# Patient Record
Sex: Female | Born: 1990 | Hispanic: No | Marital: Married | Smoking: Never smoker
Health system: Southern US, Community
[De-identification: ages and names within clinical notes are randomized; demographics above are authoritative.]

## PROBLEM LIST (undated history)

## (undated) DIAGNOSIS — F419 Anxiety disorder, unspecified: Secondary | ICD-10-CM

## (undated) DIAGNOSIS — F32A Depression, unspecified: Secondary | ICD-10-CM

## (undated) DIAGNOSIS — Z789 Other specified health status: Secondary | ICD-10-CM

## (undated) HISTORY — PX: CYST REMOVAL HAND: SHX6279

## (undated) HISTORY — PX: TONSILLECTOMY: SUR1361

---

## 2017-04-03 ENCOUNTER — Other Ambulatory Visit (HOSPITAL_COMMUNITY): Payer: Self-pay | Admitting: *Deleted

## 2017-04-03 DIAGNOSIS — O283 Abnormal ultrasonic finding on antenatal screening of mother: Secondary | ICD-10-CM

## 2017-04-19 ENCOUNTER — Encounter (HOSPITAL_COMMUNITY): Payer: Self-pay | Admitting: *Deleted

## 2017-04-20 ENCOUNTER — Encounter (HOSPITAL_COMMUNITY): Payer: Self-pay

## 2017-04-20 ENCOUNTER — Ambulatory Visit (HOSPITAL_COMMUNITY)
Admission: RE | Admit: 2017-04-20 | Discharge: 2017-04-20 | Disposition: A | Discharge status: Home / Self Care | Payer: PRIVATE HEALTH INSURANCE | Source: Ambulatory Visit | Attending: *Deleted | Admitting: *Deleted

## 2017-04-20 ENCOUNTER — Other Ambulatory Visit (HOSPITAL_COMMUNITY): Payer: Self-pay | Admitting: *Deleted

## 2017-04-20 DIAGNOSIS — E669 Obesity, unspecified: Secondary | ICD-10-CM | POA: Diagnosis not present

## 2017-04-20 DIAGNOSIS — O358XX0 Maternal care for other (suspected) fetal abnormality and damage, not applicable or unspecified: Secondary | ICD-10-CM | POA: Insufficient documentation

## 2017-04-20 DIAGNOSIS — Z3A24 24 weeks gestation of pregnancy: Secondary | ICD-10-CM | POA: Insufficient documentation

## 2017-04-20 DIAGNOSIS — O283 Abnormal ultrasonic finding on antenatal screening of mother: Secondary | ICD-10-CM | POA: Insufficient documentation

## 2017-04-20 DIAGNOSIS — Z363 Encounter for antenatal screening for malformations: Secondary | ICD-10-CM | POA: Diagnosis present

## 2017-04-20 DIAGNOSIS — Z3689 Encounter for other specified antenatal screening: Secondary | ICD-10-CM

## 2017-04-20 DIAGNOSIS — Z0379 Encounter for other suspected maternal and fetal conditions ruled out: Secondary | ICD-10-CM

## 2017-04-20 DIAGNOSIS — O99212 Obesity complicating pregnancy, second trimester: Secondary | ICD-10-CM | POA: Diagnosis not present

## 2017-04-20 HISTORY — DX: Other specified health status: Z78.9

## 2017-04-20 NOTE — Progress Notes (Signed)
No consult note generated as EIF not found on fetal anatomic survey today.

## 2017-04-25 ENCOUNTER — Encounter (HOSPITAL_COMMUNITY): Payer: Self-pay

## 2017-11-02 ENCOUNTER — Encounter (HOSPITAL_COMMUNITY): Payer: Self-pay

## 2020-09-29 ENCOUNTER — Other Ambulatory Visit: Payer: Self-pay | Admitting: Podiatry

## 2020-09-29 DIAGNOSIS — M79671 Pain in right foot: Secondary | ICD-10-CM

## 2020-11-01 ENCOUNTER — Other Ambulatory Visit (HOSPITAL_COMMUNITY): Payer: PRIVATE HEALTH INSURANCE

## 2020-11-02 ENCOUNTER — Other Ambulatory Visit (HOSPITAL_COMMUNITY): Payer: PRIVATE HEALTH INSURANCE

## 2020-11-15 NOTE — Patient Instructions (Signed)
Dorothy Stokes  11/15/2020     @PREFPERIOPPHARMACY @   Your procedure is scheduled on  11/17/2020.   Report to 11/19/2020 at  0700  A.M.   Call this number if you have problems the morning of surgery:  [phone]   Remember:  Do not eat or drink after midnight.                        Take these medicines the morning of surgery with A SIP OF WATER  Prozac, lamictal, claritin.   Place clean sheets on your bed the night before your procedure. DO NOT sleep with pets this night.  Shower with CHG the night before and the morning of your procedure. DO NOT put CHG on your face, hair or genitals.  After each shower, dry off with a clean towel, put on clean, comfortable clothes and brush your teeth.      Do not wear jewelry, make-up or nail polish.  Do not wear lotions, powders, or perfumes, or deodorant.  Do not shave 48 hours prior to surgery.  Men may shave face and neck.  Do not bring valuables to the hospital.  [facility] is not responsible for any belongings or valuables.  Contacts, dentures or bridgework may not be worn into surgery.  Leave your suitcase in the car.  After surgery it may be brought to your room.  For patients admitted to the hospital, discharge time will be determined by your treatment team.  Patients discharged the day of surgery will not be allowed to drive home and must have someone with them for 24 hours.    Special instructions:   DO NOT smoke tobacco or vape the morning of your procedure.   Please read over the following fact sheets that you were given. Coughing and Deep Breathing, Surgical Site Infection Prevention, Anesthesia Post-op Instructions and Care and Recovery After Surgery       Bunion Surgery, Care After This sheet gives you information about how to care for yourself after your procedure. Your health care provider may also give you more specific instructions. If you have problems or questions, contact your health care  provider. What can I expect after the procedure? After the procedure, it is common to have:  Redness.  Pain.  Swelling.  A small amount of fluid coming from your incision. Follow these instructions at home: If you have a post-operative brace, boot, or shoe:  Wear the post-op (post-operative) brace, boot, or shoe as told by your health care provider. Remove it only as told by your health care provider.  Loosen the brace, boot, or shoe if your toes tingle, become numb, or turn cold and blue.  Keep the brace, boot, or shoe clean and dry. If you have a cast:  Do not put pressure on any part of the cast until it is fully hardened. This may take several hours.  Do not stick anything inside the cast to scratch your skin. Doing that increases your risk of infection.  Check the skin around the cast every day. Tell your health care provider about any concerns.  You may put lotion on dry skin around the edges of the cast. Do not put lotion on the skin underneath the cast.  Keep the cast clean and dry. Bathing  Do not take baths, swim, or use a hot tub until your health care provider approves. Ask your health care provider if you may  take showers.  If your brace, boot, shoe, or cast is not waterproof: ? Do not let it get wet. ? Cover it with a watertight covering when you take a bath or a shower.  Keep your bandage (dressing) dry until your health care provider says it can be removed. Incision care  The dressing holds your toe in the correct position. Do not change the dressing until your health care provider approves.  Follow instructions from your health care provider about how to take care of your incision. Make sure you: ? Wash your hands with soap and water for at least 20 seconds before and after you change your dressing. If soap and water are not available, use hand sanitizer. ? Change your dressing as told by your health care provider. ? Leave stitches (sutures), skin glue,  or adhesive strips in place. These skin closures may need to stay in place for 2 weeks or longer. If adhesive strip edges start to loosen and curl up, you may trim the loose edges. Do not remove adhesive strips completely unless your health care provider tells you to do that.  Check your incision area every day for signs of infection. Check for: ? More redness, swelling, or pain. ? Blood or more fluid. ? Warmth. ? Pus or a bad smell.   Managing pain, stiffness, and swelling  If directed, put ice on the affected area. To do this: ? If you have a removable brace, boot, or shoe, remove it as told by your health care provider. ? Put ice in a plastic bag. ? Place a towel between your skin and the bag or between your cast and the bag. ? Leave the ice on for 20 minutes, 2-3 times a day. ? Remove the ice if your skin turns bright red. This is very important. If you cannot feel pain, heat, or cold, you have a greater risk of damage to the area.  Move your toes often to reduce stiffness and swelling.  Raise (elevate) the injured area above the level of your heart while you are sitting or lying down.   Activity  Return to your normal activities as told by your health care provider. Ask your health care provider what activities are safe for you.  If physical therapy was prescribed, do exercises as told by your health care provider. Driving  If you were given a sedative during the procedure, it can affect you for several hours. Do not drive or operate machinery until your health care provider says that it is safe.  Ask your health care provider when it is safe to drive if you have a brace, boot, shoe, or cast on your foot. Safety Do not use the affected leg to support (bear) your body weight until your health care provider says that you can. Follow weight-bearing restrictions as told. Use crutches, a cane, or a walker as told by your health care provider. General instructions  Do not use any  products that contain nicotine or tobacco, such as cigarettes, e-cigarettes, and chewing tobacco. If you need help quitting, ask your health care provider.  Take over-the-counter and prescription medicines only as told by your health care provider.  Your medicines may cause constipation. To prevent or treat constipation, you may need to: ? Drink enough fluid to keep your urine pale yellow. ? Take over-the-counter or prescription medicines. ? Eat foods that are high in fiber, such as beans, whole grains, and fresh fruits and vegetables. ? Limit foods that are high  in fat and processed sugars, such as fried or sweet foods.  Do not wear high heels or tight-fitting shoes, even after you heal.  Keep all follow-up visits. This is important. Contact a health care provider if:  You have any of these signs of infection: ? More redness, swelling, or pain around your incision. ? Blood or more fluid coming from your incision. ? Warmth coming from your incision. ? Pus or a bad smell coming from your incision. ? A fever or chills.  Your dressing gets wet or it falls off.  You have swelling in your lower leg or calf.  You have numbness or stiffness in your toes. Get help right away if:  You have severe pain.  You have shortness of breath or trouble breathing.  You have chest pain.  You have redness, swelling, pain, or warmth in your calf or leg. These symptoms may represent a serious problem that is an emergency. Do not wait to see if the symptoms will go away. Get medical help right away. Call your local emergency services (911 in the [place] Do not drive yourself to the hospital. Summary  If directed, put ice on the affected area. Leave the ice on for 20 minutes, 2-3 times a day.  Ask your health care provider when it is safe to drive if you have a brace, boot, shoe, or cast on your foot.  Do not use the affected leg to support (bear) your body weight until your health care provider says  that you can. Follow weight-bearing restrictions as told. Use crutches, a cane, or a walker as told by your health care provider. This information is not intended to replace advice given to you by your health care provider. Make sure you discuss any questions you have with your health care provider. Document Revised: 12/19/2019 Document Reviewed: 12/19/2019 Elsevier Patient Education  2021 [employer] Monitored Anesthesia Care, Care After This sheet gives you information about how to care for yourself after your procedure. Your health care provider may also give you more specific instructions. If you have problems or questions, contact your health care provider. What can I expect after the procedure? After the procedure, it is common to have:  Tiredness.  Forgetfulness about what happened after the procedure.  Impaired judgment for important decisions.  Nausea or vomiting.  Some difficulty with balance. Follow these instructions at home: For the time period you were told by your health care provider:  Rest as needed.  Do not participate in activities where you could fall or become injured.  Do not drive or use machinery.  Do not drink alcohol.  Do not take sleeping pills or medicines that cause drowsiness.  Do not make important decisions or sign legal documents.  Do not take care of children on your own.      Eating and drinking  Follow the diet that is recommended by your health care provider.  Drink enough fluid to keep your urine pale yellow.  If you vomit: ? Drink water, juice, or soup when you can drink without vomiting. ? Make sure you have little or no nausea before eating solid foods. General instructions  Have a responsible adult stay with you for the time you are told. It is important to have someone help care for you until you are awake and alert.  Take over-the-counter and prescription medicines only as told by your health care provider.  If you have  sleep apnea, surgery and certain medicines can increase your  risk for breathing problems. Follow instructions from your health care provider about wearing your sleep device: ? Anytime you are sleeping, including during daytime naps. ? While taking prescription pain medicines, sleeping medicines, or medicines that make you drowsy.  Avoid smoking.  Keep all follow-up visits as told by your health care provider. This is important. Contact a health care provider if:  You keep feeling nauseous or you keep vomiting.  You feel light-headed.  You are still sleepy or having trouble with balance after 24 hours.  You develop a rash.  You have a fever.  You have redness or swelling around the IV site. Get help right away if:  You have trouble breathing.  You have new-onset confusion at home. Summary  For several hours after your procedure, you may feel tired. You may also be forgetful and have poor judgment.  Have a responsible adult stay with you for the time you are told. It is important to have someone help care for you until you are awake and alert.  Rest as told. Do not drive or operate machinery. Do not drink alcohol or take sleeping pills.  Get help right away if you have trouble breathing, or if you suddenly become confused. This information is not intended to replace advice given to you by your health care provider. Make sure you discuss any questions you have with your health care provider. Document Revised: 04/29/2020 Document Reviewed: 07/17/2019 Elsevier Patient Education  2021 [employer]

## 2020-11-16 ENCOUNTER — Other Ambulatory Visit: Payer: Self-pay

## 2020-11-16 ENCOUNTER — Ambulatory Visit (HOSPITAL_COMMUNITY)
Admission: RE | Admit: 2020-11-16 | Discharge: 2020-11-16 | Disposition: A | Discharge status: Home / Self Care | Payer: PRIVATE HEALTH INSURANCE | Source: Ambulatory Visit | Attending: Podiatry | Admitting: Podiatry

## 2020-11-16 ENCOUNTER — Encounter (HOSPITAL_COMMUNITY)
Admission: RE | Admit: 2020-11-16 | Discharge: 2020-11-16 | Disposition: A | Discharge status: Home / Self Care | Payer: PRIVATE HEALTH INSURANCE | Source: Ambulatory Visit | Attending: Podiatry | Admitting: Podiatry

## 2020-11-16 ENCOUNTER — Other Ambulatory Visit (HOSPITAL_COMMUNITY)
Admission: RE | Admit: 2020-11-16 | Discharge: 2020-11-16 | Disposition: A | Discharge status: Home / Self Care | Payer: PRIVATE HEALTH INSURANCE | Source: Ambulatory Visit | Attending: Podiatry | Admitting: Podiatry

## 2020-11-16 ENCOUNTER — Encounter (HOSPITAL_COMMUNITY): Payer: Self-pay

## 2020-11-16 DIAGNOSIS — M79671 Pain in right foot: Secondary | ICD-10-CM | POA: Insufficient documentation

## 2020-11-16 DIAGNOSIS — Z01812 Encounter for preprocedural laboratory examination: Secondary | ICD-10-CM | POA: Diagnosis present

## 2020-11-16 DIAGNOSIS — Z20822 Contact with and (suspected) exposure to covid-19: Secondary | ICD-10-CM | POA: Diagnosis not present

## 2020-11-16 HISTORY — DX: Depression, unspecified: F32.A

## 2020-11-16 HISTORY — DX: Anxiety disorder, unspecified: F41.9

## 2020-11-16 LAB — SARS CORONAVIRUS 2 (TAT 6-24 HRS): SARS Coronavirus 2: NEGATIVE

## 2020-11-16 LAB — PREGNANCY, URINE: Preg Test, Ur: NEGATIVE

## 2020-11-17 ENCOUNTER — Ambulatory Visit (HOSPITAL_COMMUNITY): Payer: PRIVATE HEALTH INSURANCE

## 2020-11-17 ENCOUNTER — Encounter (HOSPITAL_COMMUNITY)
Admission: RE | Disposition: A | Discharge status: Home / Self Care | Payer: Self-pay | Source: Home / Self Care | Attending: Podiatry

## 2020-11-17 ENCOUNTER — Ambulatory Visit (HOSPITAL_COMMUNITY)
Admission: RE | Admit: 2020-11-17 | Discharge: 2020-11-17 | Disposition: A | Discharge status: Home / Self Care | Payer: PRIVATE HEALTH INSURANCE | Attending: Podiatry | Admitting: Podiatry

## 2020-11-17 ENCOUNTER — Ambulatory Visit (HOSPITAL_COMMUNITY): Payer: PRIVATE HEALTH INSURANCE | Admitting: Certified Registered"

## 2020-11-17 ENCOUNTER — Encounter (HOSPITAL_COMMUNITY): Payer: Self-pay

## 2020-11-17 DIAGNOSIS — Z91040 Latex allergy status: Secondary | ICD-10-CM | POA: Insufficient documentation

## 2020-11-17 DIAGNOSIS — Z8249 Family history of ischemic heart disease and other diseases of the circulatory system: Secondary | ICD-10-CM | POA: Insufficient documentation

## 2020-11-17 DIAGNOSIS — Z79899 Other long term (current) drug therapy: Secondary | ICD-10-CM | POA: Insufficient documentation

## 2020-11-17 DIAGNOSIS — M1990 Unspecified osteoarthritis, unspecified site: Secondary | ICD-10-CM | POA: Diagnosis not present

## 2020-11-17 DIAGNOSIS — M2011 Hallux valgus (acquired), right foot: Secondary | ICD-10-CM | POA: Insufficient documentation

## 2020-11-17 DIAGNOSIS — Z833 Family history of diabetes mellitus: Secondary | ICD-10-CM | POA: Diagnosis not present

## 2020-11-17 DIAGNOSIS — Z9889 Other specified postprocedural states: Secondary | ICD-10-CM

## 2020-11-17 HISTORY — PX: BUNIONECTOMY: SHX129

## 2020-11-17 SURGERY — BUNIONECTOMY
Anesthesia: General | Site: Toe | Laterality: Right

## 2020-11-17 MED ORDER — PROPOFOL 500 MG/50ML IV EMUL
INTRAVENOUS | Status: DC | PRN
  Administered 2020-11-17: 100 ug/kg/min via INTRAVENOUS

## 2020-11-17 MED ORDER — FENTANYL CITRATE (PF) 100 MCG/2ML IJ SOLN
INTRAMUSCULAR | Status: AC
  Filled 2020-11-17: qty 2

## 2020-11-17 MED ORDER — DEXMEDETOMIDINE (PRECEDEX) IN NS 20 MCG/5ML (4 MCG/ML) IV SYRINGE
PREFILLED_SYRINGE | INTRAVENOUS | Status: DC | PRN
  Administered 2020-11-17: 2 ug via INTRAVENOUS
  Administered 2020-11-17 (×2): 4 ug via INTRAVENOUS
  Administered 2020-11-17: 8 ug via INTRAVENOUS
  Administered 2020-11-17: 2 ug via INTRAVENOUS
  Administered 2020-11-17: 4 ug via INTRAVENOUS

## 2020-11-17 MED ORDER — PROPOFOL 10 MG/ML IV BOLUS
INTRAVENOUS | Status: AC
  Filled 2020-11-17: qty 20

## 2020-11-17 MED ORDER — CHLORHEXIDINE GLUCONATE CLOTH 2 % EX PADS: 6.0000 | MEDICATED_PAD | Freq: Once | CUTANEOUS | Status: DC

## 2020-11-17 MED ORDER — GLYCOPYRROLATE PF 0.2 MG/ML IJ SOSY
PREFILLED_SYRINGE | INTRAMUSCULAR | Status: AC
  Filled 2020-11-17: qty 1

## 2020-11-17 MED ORDER — DEXMEDETOMIDINE (PRECEDEX) IN NS 20 MCG/5ML (4 MCG/ML) IV SYRINGE
PREFILLED_SYRINGE | INTRAVENOUS | Status: AC
  Filled 2020-11-17: qty 5

## 2020-11-17 MED ORDER — CEFAZOLIN SODIUM-DEXTROSE 2-4 GM/100ML-% IV SOLN
2.0000 g | INTRAVENOUS | Status: AC
  Administered 2020-11-17: 2 g via INTRAVENOUS
  Filled 2020-11-17: qty 100

## 2020-11-17 MED ORDER — DEXAMETHASONE SODIUM PHOSPHATE 4 MG/ML IJ SOLN
INTRAMUSCULAR | Status: DC | PRN
  Administered 2020-11-17: 4 mg via INTRAVENOUS

## 2020-11-17 MED ORDER — PHENYLEPHRINE HCL (PRESSORS) 10 MG/ML IV SOLN
INTRAVENOUS | Status: AC
  Filled 2020-11-17: qty 1

## 2020-11-17 MED ORDER — PROPOFOL 10 MG/ML IV BOLUS
INTRAVENOUS | Status: DC | PRN
  Administered 2020-11-17: 50 mg via INTRAVENOUS

## 2020-11-17 MED ORDER — LACTATED RINGERS IV SOLN: INTRAVENOUS | Status: DC

## 2020-11-17 MED ORDER — MIDAZOLAM HCL 2 MG/2ML IJ SOLN
INTRAMUSCULAR | Status: DC | PRN
  Administered 2020-11-17: 2 mg via INTRAVENOUS

## 2020-11-17 MED ORDER — ONDANSETRON HCL 4 MG/2ML IJ SOLN
INTRAMUSCULAR | Status: AC
  Filled 2020-11-17: qty 2

## 2020-11-17 MED ORDER — BUPIVACAINE-MELOXICAM ER 200-6 MG/7ML IJ SOLN
INTRAMUSCULAR | Status: DC | PRN
  Administered 2020-11-17: 1.5 mL

## 2020-11-17 MED ORDER — DEXAMETHASONE SODIUM PHOSPHATE 10 MG/ML IJ SOLN
INTRAMUSCULAR | Status: AC
  Filled 2020-11-17: qty 1

## 2020-11-17 MED ORDER — PHENYLEPHRINE 40 MCG/ML (10ML) SYRINGE FOR IV PUSH (FOR BLOOD PRESSURE SUPPORT)
PREFILLED_SYRINGE | INTRAVENOUS | Status: AC
  Filled 2020-11-17: qty 10

## 2020-11-17 MED ORDER — ONDANSETRON HCL 4 MG/2ML IJ SOLN
INTRAMUSCULAR | Status: DC | PRN
Start: 2020-11-17 — End: 2020-11-17
  Administered 2020-11-17: 4 mg via INTRAVENOUS

## 2020-11-17 MED ORDER — FENTANYL CITRATE (PF) 100 MCG/2ML IJ SOLN
INTRAMUSCULAR | Status: DC | PRN
  Administered 2020-11-17 (×2): 50 ug via INTRAVENOUS

## 2020-11-17 MED ORDER — PHENYLEPHRINE HCL (PRESSORS) 10 MG/ML IV SOLN
INTRAVENOUS | Status: DC | PRN
  Administered 2020-11-17: 40 ug via INTRAVENOUS

## 2020-11-17 MED ORDER — FENTANYL CITRATE (PF) 100 MCG/2ML IJ SOLN: 25.0000 ug | INTRAMUSCULAR | Status: DC | PRN

## 2020-11-17 MED ORDER — KETAMINE HCL 50 MG/5ML IJ SOSY
PREFILLED_SYRINGE | INTRAMUSCULAR | Status: AC
  Filled 2020-11-17: qty 5

## 2020-11-17 MED ORDER — ONDANSETRON HCL 4 MG/2ML IJ SOLN: 4.0000 mg | Freq: Once | INTRAMUSCULAR | Status: DC | PRN

## 2020-11-17 MED ORDER — LIDOCAINE HCL (PF) 1 % IJ SOLN
INTRAMUSCULAR | Status: AC
  Filled 2020-11-17: qty 30

## 2020-11-17 MED ORDER — LIDOCAINE HCL 1 % IJ SOLN
INTRAMUSCULAR | Status: DC | PRN
  Administered 2020-11-17: 14 mL via INTRAMUSCULAR

## 2020-11-17 MED ORDER — GLYCOPYRROLATE 0.2 MG/ML IJ SOLN
INTRAMUSCULAR | Status: DC | PRN
  Administered 2020-11-17: .2 mg via INTRAVENOUS

## 2020-11-17 MED ORDER — KETAMINE HCL 10 MG/ML IJ SOLN
INTRAMUSCULAR | Status: DC | PRN
  Administered 2020-11-17: 10 mg via INTRAVENOUS
  Administered 2020-11-17: 5 mg via INTRAVENOUS
  Administered 2020-11-17: 10 mg via INTRAVENOUS
  Administered 2020-11-17 (×5): 5 mg via INTRAVENOUS

## 2020-11-17 MED ORDER — LIDOCAINE HCL (PF) 2 % IJ SOLN
INTRAMUSCULAR | Status: AC
  Filled 2020-11-17: qty 5

## 2020-11-17 MED ORDER — BUPIVACAINE HCL (PF) 0.5 % IJ SOLN
INTRAMUSCULAR | Status: AC
  Filled 2020-11-17: qty 30

## 2020-11-17 MED ORDER — SODIUM CHLORIDE 0.9 % IR SOLN
Status: DC | PRN
  Administered 2020-11-17: 1000 mL

## 2020-11-17 MED ORDER — MIDAZOLAM HCL 2 MG/2ML IJ SOLN
INTRAMUSCULAR | Status: AC
  Filled 2020-11-17: qty 2

## 2020-11-17 MED ORDER — LIDOCAINE HCL (CARDIAC) PF 100 MG/5ML IV SOSY
PREFILLED_SYRINGE | INTRAVENOUS | Status: DC | PRN
  Administered 2020-11-17: 60 mg via INTRATRACHEAL

## 2020-11-17 SURGICAL SUPPLY — 52 items
APL SKNCLS STERI-STRIP NONHPOA (GAUZE/BANDAGES/DRESSINGS) ×1
BANDAGE ELASTIC 4 VELCRO NS (GAUZE/BANDAGES/DRESSINGS) ×1 IMPLANT
BANDAGE ESMARK 4X12 BL STRL LF (DISPOSABLE) ×1 IMPLANT
BENZOIN TINCTURE PRP APPL 2/3 (GAUZE/BANDAGES/DRESSINGS) ×2 IMPLANT
BIT DRILL CANN 3.0 (BIT) ×3 IMPLANT
BLADE AVERAGE 25X9 (BLADE) ×2 IMPLANT
BLADE SURG 15 STRL LF DISP TIS (BLADE) ×2 IMPLANT
BLADE SURG 15 STRL SS (BLADE) ×4
BNDG CMPR 12X4 ELC STRL LF (DISPOSABLE) ×1
BNDG CMPR STD VLCR NS LF 5.8X4 (GAUZE/BANDAGES/DRESSINGS) ×1
BNDG CONFORM 2 STRL LF (GAUZE/BANDAGES/DRESSINGS) ×2 IMPLANT
BNDG ELASTIC 4X5.8 VLCR NS LF (GAUZE/BANDAGES/DRESSINGS) ×2 IMPLANT
BNDG ESMARK 4X12 BLUE STRL LF (DISPOSABLE) ×2
BNDG GAUZE ELAST 4 BULKY (GAUZE/BANDAGES/DRESSINGS) ×2 IMPLANT
BOOT STEPPER DURA MED (SOFTGOODS) ×1 IMPLANT
CLOSURE STERI-STRIP 1/4X4 (GAUZE/BANDAGES/DRESSINGS) ×2 IMPLANT
CLOTH BEACON ORANGE TIMEOUT ST (SAFETY) ×2 IMPLANT
COVER LIGHT HANDLE STERIS (MISCELLANEOUS) ×4 IMPLANT
COVER WAND RF STERILE (DRAPES) ×2 IMPLANT
CUFF TOURN SGL QUICK 18X4 (TOURNIQUET CUFF) ×1 IMPLANT
DECANTER SPIKE VIAL GLASS SM (MISCELLANEOUS) ×3 IMPLANT
DRAPE C-ARM FOLDED MOBILE STRL (DRAPES) ×1 IMPLANT
DRSG ADAPTIC 3X8 NADH LF (GAUZE/BANDAGES/DRESSINGS) ×2 IMPLANT
ELECT REM PT RETURN 9FT ADLT (ELECTROSURGICAL) ×2
ELECTRODE REM PT RTRN 9FT ADLT (ELECTROSURGICAL) ×1 IMPLANT
GAUZE SPONGE 4X4 12PLY STRL (GAUZE/BANDAGES/DRESSINGS) ×2 IMPLANT
GLOVE SURG ENC MOIS LTX SZ7.5 (GLOVE) ×2 IMPLANT
GLOVE SURG LTX SZ7 (GLOVE) ×4 IMPLANT
GLOVE SURG UNDER POLY LF SZ7 (GLOVE) ×2 IMPLANT
GLOVE SURG UNDER POLY LF SZ7.5 (GLOVE) ×4 IMPLANT
GOWN STRL REUS W/ TWL LRG LVL3 (GOWN DISPOSABLE) ×1 IMPLANT
GOWN STRL REUS W/TWL LRG LVL3 (GOWN DISPOSABLE) ×6 IMPLANT
K-WIRE 1.1 (WIRE) ×4
K-WIRE FX100X1.1XTROC TIP (WIRE) ×2
KIT TURNOVER KIT A (KITS) ×2 IMPLANT
KWIRE FX100X1.1XTROC TIP (WIRE) IMPLANT
MANIFOLD NEPTUNE II (INSTRUMENTS) ×2 IMPLANT
NDL HYPO 25X1 1.5 SAFETY (NEEDLE) ×5 IMPLANT
NEEDLE HYPO 25X1 1.5 SAFETY (NEEDLE) ×4 IMPLANT
NS IRRIG 1000ML POUR BTL (IV SOLUTION) ×2 IMPLANT
PACK BASIC LIMB (CUSTOM PROCEDURE TRAY) ×2 IMPLANT
PAD ARMBOARD 7.5X6 YLW CONV (MISCELLANEOUS) ×2 IMPLANT
RASP SM TEAR CROSS CUT (RASP) IMPLANT
SCREW CAN COMP ST 3.0X26MM (Screw) ×1 IMPLANT
SCREW COUNTERSINK 3.0 (Screw) ×1 IMPLANT
SET BASIN LINEN APH (SET/KITS/TRAYS/PACK) ×2 IMPLANT
SUT PROLENE 4 0 PS 2 18 (SUTURE) ×2 IMPLANT
SUT VIC AB 2-0 CT2 27 (SUTURE) ×2 IMPLANT
SUT VIC AB 4-0 PS2 27 (SUTURE) IMPLANT
SUT VICRYL AB 3-0 FS1 BRD 27IN (SUTURE) ×2 IMPLANT
SYR BULB IRRIG 60ML STRL (SYRINGE) ×2 IMPLANT
SYR CONTROL 10ML LL (SYRINGE) ×4 IMPLANT

## 2020-11-17 NOTE — Transfer of Care (Signed)
Immediate Anesthesia Transfer of Care Note  Patient: Dorothy Stokes  Procedure(s) Performed: HALLUX VALGUS AUSTIN BUNIONECTOMY, EHL lengthening (Right Toe)  Patient Location: PACU  Anesthesia Type:MAC combined with regional for post-op pain  Level of Consciousness: awake, alert , oriented and patient cooperative  Airway & Oxygen Therapy: Patient Spontanous Breathing  Post-op Assessment: Report given to RN and Post -op Vital signs reviewed and stable  Post vital signs: Reviewed and stable  Last Vitals:  Vitals Value Taken Time  BP 138/86 11/17/20 1015  Temp 36.6 C 11/17/20 1015  Pulse 91 11/17/20 1016  Resp    SpO2 91 % 11/17/20 1016  Vitals shown include unvalidated device data.  Last Pain:  Vitals:   11/17/20 0728  TempSrc: Oral  PainSc: 5       Patients Stated Pain Goal: 8 (25/42/70 6237)  Complications: No complications documented.

## 2020-11-17 NOTE — Anesthesia Preprocedure Evaluation (Signed)
Anesthesia Evaluation  Patient identified by MRN, date of birth, ID band Patient awake    Reviewed: Allergy & Precautions, H&P , NPO status , Patient's Chart, lab work & pertinent test results, reviewed documented beta blocker date and time   Airway Mallampati: II  TM Distance: >3 FB Neck ROM: full    Dental no notable dental hx.    Pulmonary neg pulmonary ROS,    Pulmonary exam normal breath sounds clear to auscultation       Cardiovascular Exercise Tolerance: Good negative cardio ROS   Rhythm:regular Rate:Normal     Neuro/Psych PSYCHIATRIC DISORDERS Anxiety Depression negative neurological ROS     GI/Hepatic negative GI ROS, Neg liver ROS,   Endo/Other  Morbid obesity  Renal/GU negative Renal ROS  negative genitourinary   Musculoskeletal   Abdominal   Peds  Hematology negative hematology ROS (+)   Anesthesia Other Findings   Reproductive/Obstetrics negative OB ROS                             Anesthesia Physical Anesthesia Plan  ASA: III  Anesthesia Plan: General   Post-op Pain Management:    Induction:   PONV Risk Score and Plan: Propofol infusion  Airway Management Planned:   Additional Equipment:   Intra-op Plan:   Post-operative Plan:   Informed Consent: I have reviewed the patients History and Physical, chart, labs and discussed the procedure including the risks, benefits and alternatives for the proposed anesthesia with the patient or authorized representative who has indicated his/her understanding and acceptance.     Dental Advisory Given  Plan Discussed with: CRNA  Anesthesia Plan Comments:         Anesthesia Quick Evaluation

## 2020-11-17 NOTE — Discharge Instructions (Signed)
.These instructions will give you an idea of what to expect after surgery and how to manage issues that may arise before your first post op office visit.  Pain Management Pain is best managed by "staying ahead" of it. If pain gets out of control, it is difficult to get it back under control. Local anesthesia that lasts 6-8 hours is used to numb the foot and decrease pain.  For the best pain control, take the pain medication every 4 hours for the first 2 days post op. On the third day pain medication can be taken as needed.   Post Op Nausea Nausea is common after surgery, so it is managed proactively.  If prescribed, use the prescribed nausea medication regularly for the first 2 days post op.  Bandages Do not worry if there is blood on the bandage. What looks like a lot of blood on the bandage is actually a small amount. Blood on the dressing spreads out as it is absorbed by the gauze, the same way a drop of water spreads out on a paper towel.  If the bandages feel wet or dry, stiff and uncomfortable, call the office during office hours and we will schedule a time for you to have the bandage changed.  Unless you are specifically told otherwise, we will do the first bandage change in the office.  Keep your bandage dry. If the bandage becomes wet or soiled, notify the office and we will schedule a time to change the bandage.  Activity It is best to spend most of the first 2 days after surgery lying down with the foot elevated above the level of your heart. You may put weight on your heel while wearing the surgical shoe.   You may only get up to go to the restroom.  Driving Do not drive until you are able to respond in an emergency (i.e. slam on the brakes). This usually occurs after the bone has healed - 6 to 8 weeks.  Call the Office If you have a fever over 101F.  If you have increasing pain after the initial post op pain has settled down.  If you have increasing redness, swelling, or  drainage.  If you have any questions or concerns.       Monitored Anesthesia Care, Care After This sheet gives you information about how to care for yourself after your procedure. Your health care provider may also give you more specific instructions. If you have problems or questions, contact your health care provider. What can I expect after the procedure? After the procedure, it is common to have:  Tiredness.  Forgetfulness about what happened after the procedure.  Impaired judgment for important decisions.  Nausea or vomiting.  Some difficulty with balance. Follow these instructions at home: For the time period you were told by your health care provider:  Rest as needed.  Do not participate in activities where you could fall or become injured.  Do not drive or use machinery.  Do not drink alcohol.  Do not take sleeping pills or medicines that cause drowsiness.  Do not make important decisions or sign legal documents.  Do not take care of children on your own.      Eating and drinking  Follow the diet that is recommended by your health care provider.  Drink enough fluid to keep your urine pale yellow.  If you vomit: ? Drink water, juice, or soup when you can drink without vomiting. ? Make sure you have little  or no nausea before eating solid foods. General instructions  Have a responsible adult stay with you for the time you are told. It is important to have someone help care for you until you are awake and alert.  Take over-the-counter and prescription medicines only as told by your health care provider.  If you have sleep apnea, surgery and certain medicines can increase your risk for breathing problems. Follow instructions from your health care provider about wearing your sleep device: ? Anytime you are sleeping, including during daytime naps. ? While taking prescription pain medicines, sleeping medicines, or medicines that make you drowsy.  Avoid  smoking.  Keep all follow-up visits as told by your health care provider. This is important. Contact a health care provider if:  You keep feeling nauseous or you keep vomiting.  You feel light-headed.  You are still sleepy or having trouble with balance after 24 hours.  You develop a rash.  You have a fever.  You have redness or swelling around the IV site. Get help right away if:  You have trouble breathing.  You have new-onset confusion at home. Summary  For several hours after your procedure, you may feel tired. You may also be forgetful and have poor judgment.  Have a responsible adult stay with you for the time you are told. It is important to have someone help care for you until you are awake and alert.  Rest as told. Do not drive or operate machinery. Do not drink alcohol or take sleeping pills.  Get help right away if you have trouble breathing, or if you suddenly become confused. This information is not intended to replace advice given to you by your health care provider. Make sure you discuss any questions you have with your health care provider. Document Revised: 04/29/2020 Document Reviewed: 07/17/2019 Elsevier Patient Education  2021 Elsevier Inc.      [place] [facility] [place] [facility] UNTIL Saturday November 20, 2020. DO NOT USE ANY ADDITIONAL NUMBING MEDICATIONS WITHOUT CONSULTING A PHYSICIAN UNTIL AFTER Saturday

## 2020-11-17 NOTE — Progress Notes (Signed)
Patient arrived to pre op holding area with gel nails on bilateral toes.  Attempted to remove paint with nail polish remover.  Patient stated the color is in the Gel.  Dr Allena Katz notified.  Cecile Sheerer OR charge nurse notified.  No orders given

## 2020-11-17 NOTE — Op Note (Signed)
11/17/2020  10:10 AM  PATIENT:  Dorothy Stokes  30 y.o. 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  PROCEDURE:  Procedure(s): HALLUX VALGUS AUSTIN BUNIONECTOMY, EHL lengthening (Right)  SURGEON:  Surgeon(s) and Role:    * Rahn Lacuesta, Dance movement psychotherapist, DPM - Primary  PHYSICIAN ASSISTANT: none  ASSISTANTS: none   ANESTHESIA:   local and MAC  EBL:  10 mL   LOCAL MEDICATIONS USED:  MARCAINE   , LIDOCAINE  and Amount: 15 ml  SPECIMEN:  No Specimen  Materials used: Medartis 41m screw.   TOURNIQUET:   Total Tourniquet Time Documented: Calf (Right) - 75 minutes Total: Calf (Right) - 75 minutes  PLAN OF CARE: Discharge to home after PACU  PATIENT DISPOSITION:  PACU - hemodynamically stable.   Delay start of Pharmacological VTE agent (>24hrs) due to surgical blood loss or risk of bleeding: not applicable  Patient was brought into the operating room laid supine on the operating table. Ankle tourniquet was applied to the surgical extremity. Following IV sedation, a local block was achieved using 15cc of mixture of 1% plain lidocaine with 0.5% marcaine. The foot was the prepped, scrubbed and draped in aseptic manner. Using an esmarch band the tourniquet on the surgical site was inflatted at 2538mG.   Attention was then directed to the dorsomedial aspect of the right first metatarsophalangeal joint where a 6-cm linear incision was made medial and parallel to the course of the extensor hallucis longus tendon.  The incision was deepened through subcutaneous tissues.  Vital neurovascular structures were identified and retracted.  All bleeders were identified and cauterized.  The incision was deepened to the level of the first metatarsophalangeal joint capsule.  An inverted L-type capsulotomy was performed over the dorsal aspect of the first metatarsophalangeal joint.  The capsular and  periosteal structures were dissected free of their osseous attachments and reflected medially and laterally thus exposing the head of the first metatarsal at the operative site.  Utilizing a sagittal saw, the medial prominence was resected and passed from the operative field.  All rough edges were smoothed.    Attention was directed to the first interspace via the original skin incision.  Using sharp and blunt dissection, the fibular sesamoid was freed of its soft tissue attachments proximally, laterally and distally.  The conjoined tendon of the adductor hallucis muscle was identified and transected at its attachment to the base of the proximal phalanx of the hallux.  The lateral contracture present on the hallux was noted to be reduced and the sesamoid apparatus was noted to float into a more corrected medial position.    Attention was redirected to the medial aspect of the first metatarsal head where a through and through chevron osteotomy was created in the metaphyseal region of the first metatarsal bone using a sagittal bone saw.  The apex of the osteotomy pointed distally.  Upon completion of the osteotomy the capital fragment was distracted and shifted laterally into a more corrected position.  A k-wire from the Medartis cannulated screw set was inserted across the osteotomy site from a dorsal proximal to plantar distal direction. Medartis 2661mannulated screw was inserted across the k-wire.  Adequate compression was noted.  The position of the screw was confirmed with fluoroscopy and noted to be in acceptable alignment.    The remaining medial bone shelf was resected utilizing a sagittal bone saw and passed  from the operative field.  The area was copiously irrigated.  At this time excess capsule from the medial side was excised.The periosteal and capsular tissues were approximated with 2-0 Vicryl suture. At this time it was noted the EHL tendon was taught and causing dorsiflexion of the distal  phalanx. EHL lengthening was performed using z incision of the tendon. The tendon was repaired using 2-0 Vicryl.  Prior to closer Zynrelif 2.5 to 3cc was injected for post op pain control.  3-0 Vicryl was used to approximate the subcutaneous tissues. 3-0 Vicryl was used to approximate the skin in a subcuticular manner.  4-0 prolene was used to reinforce the skin.   Dry sterile dressing applied. Tourniquet was deflated. Capillary refill time was brisk to all lesser toes. Pt. Was transferred to PACU. Patient will be placed in cam walker with partial weightbearing status.

## 2020-11-17 NOTE — Anesthesia Postprocedure Evaluation (Signed)
Anesthesia Post Note  Patient: Christen Bame  Procedure(s) Performed: HALLUX VALGUS AUSTIN BUNIONECTOMY, EHL lengthening (Right Toe)  Patient location during evaluation: PACU Anesthesia Type: General Level of consciousness: awake Pain management: pain level controlled Vital Signs Assessment: post-procedure vital signs reviewed and stable Respiratory status: spontaneous breathing and respiratory function stable Cardiovascular status: blood pressure returned to baseline and stable Postop Assessment: no headache and no apparent nausea or vomiting Anesthetic complications: no   No complications documented.   Last Vitals:  Vitals:   11/17/20 1015 11/17/20 1030  BP: 138/86 119/87  Pulse: 93 70  Resp:  (!) 9  Temp: 36.6 C   SpO2: 96% 98%    Last Pain:  Vitals:   11/17/20 1015  TempSrc:   PainSc: 0-No pain                 Windell Norfolk

## 2020-11-17 NOTE — H&P (Signed)
.   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  The various methods of treatment have been discussed with the patient.  No guarantees were given.  After consideration of risks, benefits and other options for treatment, the patient has consented to surgery.  I have reviewed the patients' chart and labs.    Patient Vitals for the past 24 hrs:  BP Temp Temp src Pulse Resp SpO2  11/17/20 0728 125/69 98.5 F (36.9 C) Oral 67 18 99 %    A history and physical examination was performed in my office.  The patient was reexamined.  There have been no changes to this history and physical examination.  Erskine Emery, DPM

## 2020-11-17 NOTE — Addendum Note (Signed)
Addendum  created 11/17/20 1429 by Julian Reil, CRNA   Charge Capture section accepted

## 2020-11-17 NOTE — Brief Op Note (Signed)
11/17/2020  10:10 AM  PATIENT:  Dorothy Stokes  30 y.o. 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  PROCEDURE:  Procedure(s): HALLUX VALGUS AUSTIN BUNIONECTOMY, EHL lengthening (Right)  SURGEON:  Surgeon(s) and Role:    * Ariellah Faust, Dance movement psychotherapist, DPM - Primary  PHYSICIAN ASSISTANT: none  ASSISTANTS: none   ANESTHESIA:   local and MAC  EBL:  10 mL   BLOOD ADMINISTERED:none  DRAINS: none   LOCAL MEDICATIONS USED:  MARCAINE   , LIDOCAINE  and Amount: 15 ml  SPECIMEN:  No Specimen  DISPOSITION OF SPECIMEN:  N/A  COUNTS:  YES  TOURNIQUET:   Total Tourniquet Time Documented: Calf (Right) - 75 minutes Total: Calf (Right) - 75 minutes   DICTATION: .Viviann Spare Dictation  PLAN OF CARE: Discharge to home after PACU  PATIENT DISPOSITION:  PACU - hemodynamically stable.   Delay start of Pharmacological VTE agent (>24hrs) due to surgical blood loss or risk of bleeding: not applicable

## 2020-11-22 ENCOUNTER — Encounter (HOSPITAL_COMMUNITY): Payer: Self-pay | Admitting: Podiatry

## 2022-01-07 IMAGING — DX DG FOOT COMPLETE 3+V*R*
3 series · 3 of 3 positions shown · non-contrast
Comparison: Right foot x-rays from yesterday.

CLINICAL DATA: Post surgery.

EXAM:
RIGHT FOOT COMPLETE - 3+ VIEW

[foot ap]
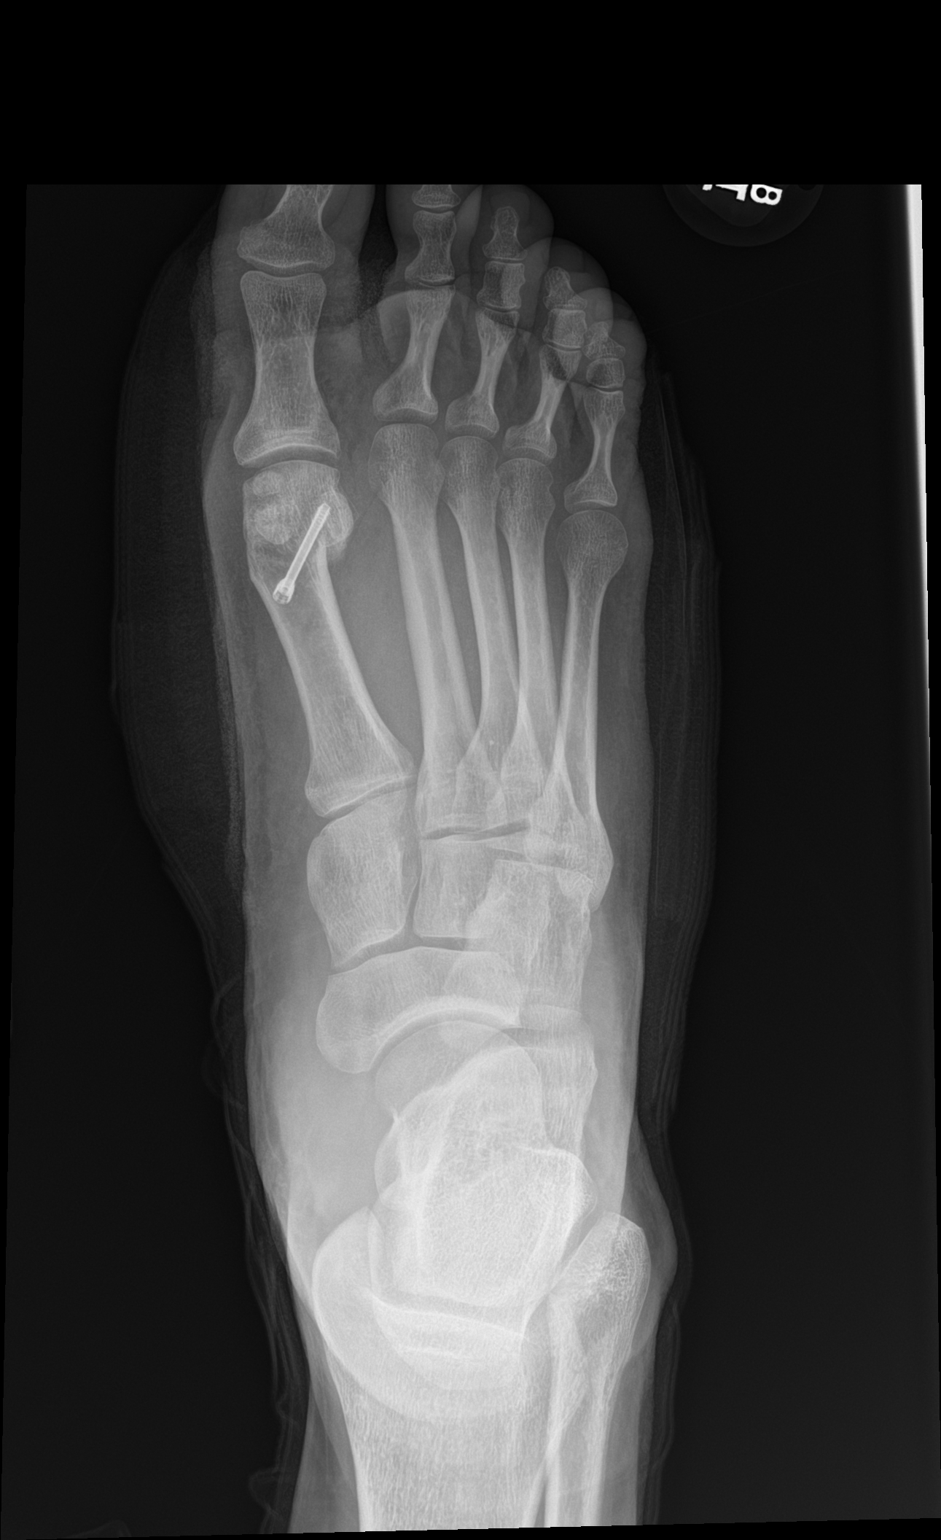

[foot obl]
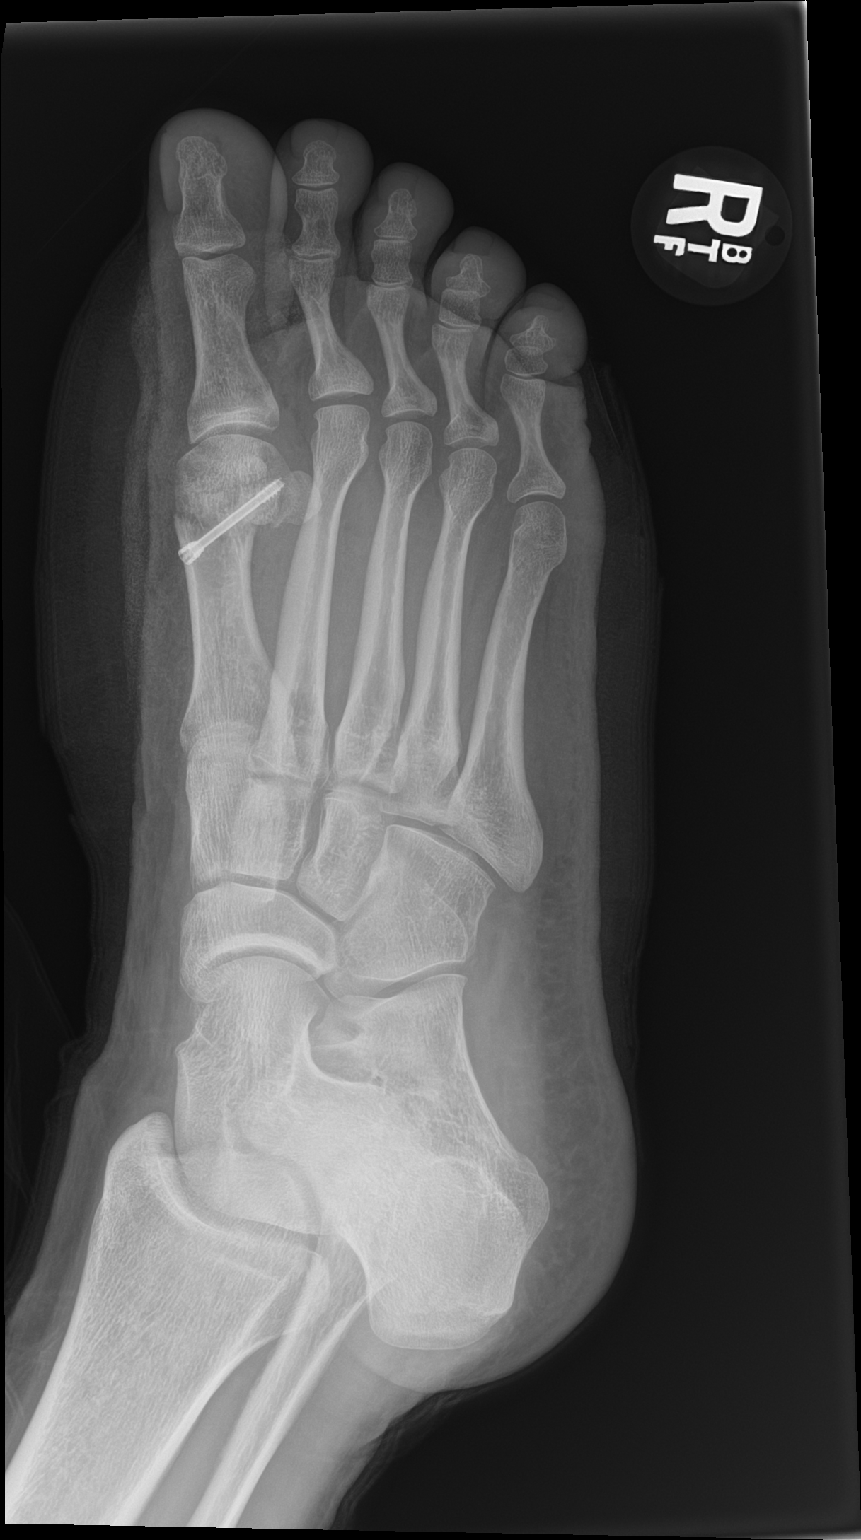

[foot lat]
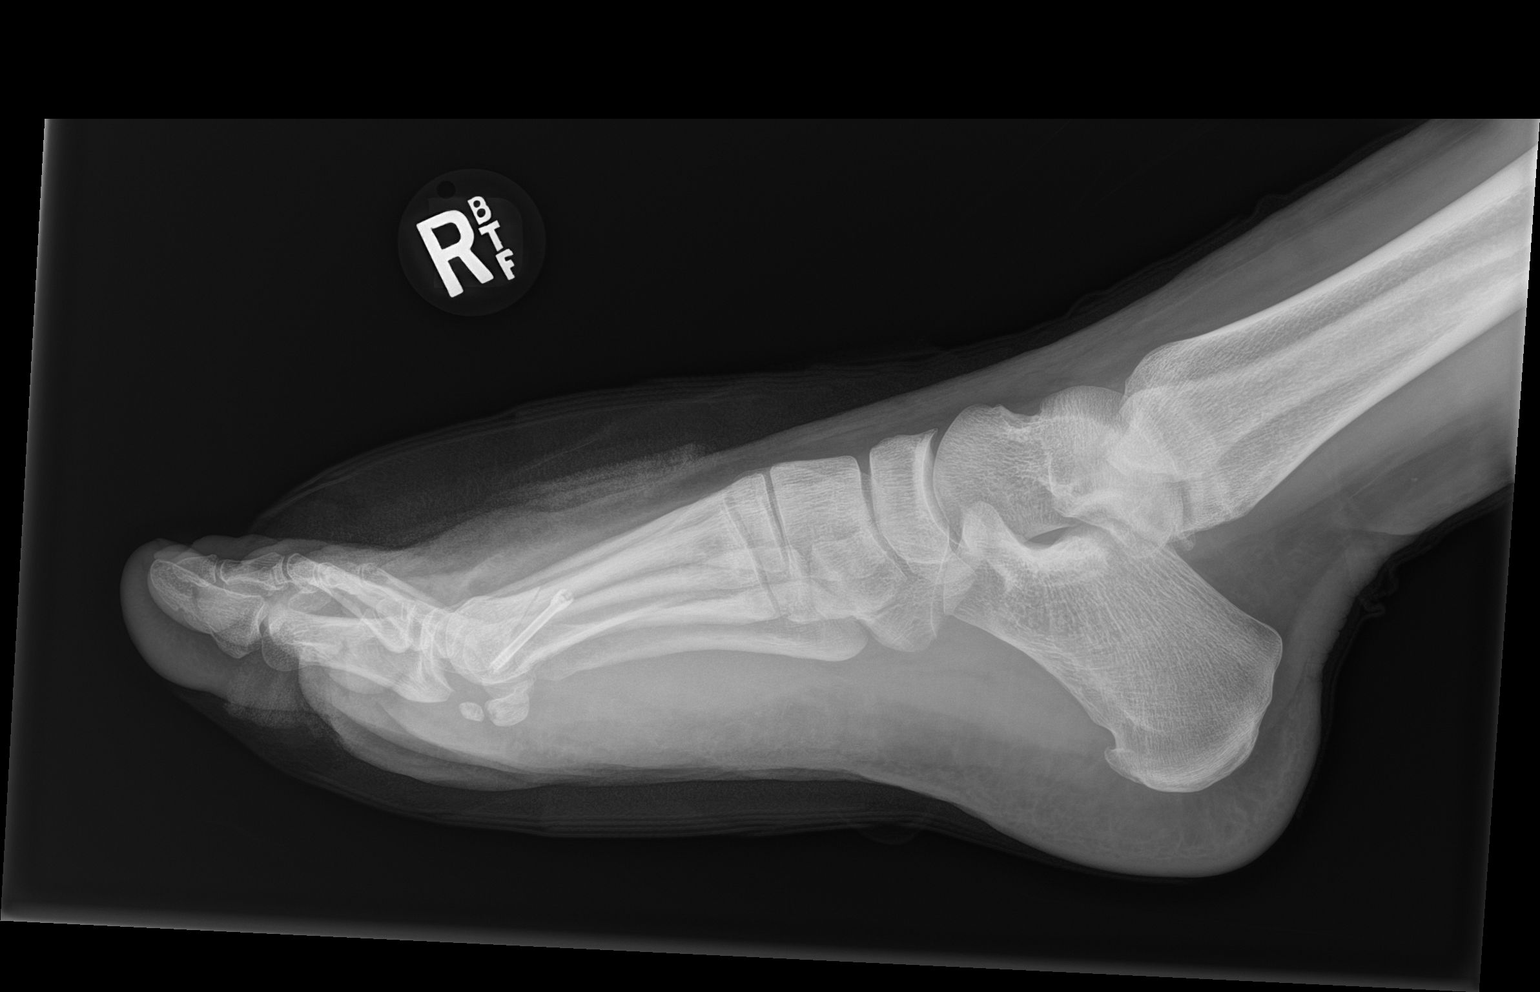

[3 of 3 positions shown; findings below may reference images not displayed]

FINDINGS: Postsurgical changes related to interval distal first metatarsal
osteotomy with single screw fixation. No acute fracture or
dislocation. Joint spaces are preserved. Bone mineralization is
normal. Expected postsurgical changes around the first MTP joint.
IMPRESSION: 1. Interval distal first metatarsal osteotomy without acute
postoperative complication.
